# Patient Record
Sex: Male | Born: 1955 | Race: White | Hispanic: No | Marital: Married | Smoking: Never smoker
Health system: Southern US, Community
[De-identification: ages and names within clinical notes are randomized; demographics above are authoritative.]

## PROBLEM LIST (undated history)

## (undated) DIAGNOSIS — E669 Obesity, unspecified: Secondary | ICD-10-CM

## (undated) DIAGNOSIS — E785 Hyperlipidemia, unspecified: Secondary | ICD-10-CM

## (undated) DIAGNOSIS — I25119 Atherosclerotic heart disease of native coronary artery with unspecified angina pectoris: Secondary | ICD-10-CM

## (undated) DIAGNOSIS — N200 Calculus of kidney: Secondary | ICD-10-CM

## (undated) HISTORY — PX: FOOT SURGERY: SHX648

---

## 2006-05-30 HISTORY — PX: CORONARY ARTERY BYPASS GRAFT: SHX141

## 2007-01-25 ENCOUNTER — Ambulatory Visit: Payer: Self-pay | Admitting: Cardiothoracic Surgery

## 2007-03-14 ENCOUNTER — Ambulatory Visit: Payer: Self-pay | Admitting: Thoracic Surgery (Cardiothoracic Vascular Surgery)

## 2015-11-28 HISTORY — PX: PERCUTANEOUS CORONARY ROTOBLATOR INTERVENTION (PCI-R): SHX6015

## 2016-04-04 ENCOUNTER — Inpatient Hospital Stay (HOSPITAL_COMMUNITY)
Admission: EM | Admit: 2016-04-04 | Discharge: 2016-04-06 | DRG: 281 | Disposition: A | Discharge status: Home / Self Care | Payer: BLUE CROSS/BLUE SHIELD | Attending: Cardiovascular Disease | Admitting: Cardiovascular Disease

## 2016-04-04 ENCOUNTER — Encounter (HOSPITAL_COMMUNITY): Payer: Self-pay | Admitting: Physician Assistant

## 2016-04-04 ENCOUNTER — Other Ambulatory Visit: Payer: Self-pay

## 2016-04-04 ENCOUNTER — Emergency Department (HOSPITAL_COMMUNITY): Payer: BLUE CROSS/BLUE SHIELD

## 2016-04-04 DIAGNOSIS — I214 Non-ST elevation (NSTEMI) myocardial infarction: Secondary | ICD-10-CM

## 2016-04-04 DIAGNOSIS — T82855A Stenosis of coronary artery stent, initial encounter: Secondary | ICD-10-CM | POA: Diagnosis present

## 2016-04-04 DIAGNOSIS — Z955 Presence of coronary angioplasty implant and graft: Secondary | ICD-10-CM

## 2016-04-04 DIAGNOSIS — R739 Hyperglycemia, unspecified: Secondary | ICD-10-CM | POA: Diagnosis not present

## 2016-04-04 DIAGNOSIS — I4729 Other ventricular tachycardia: Secondary | ICD-10-CM

## 2016-04-04 DIAGNOSIS — R030 Elevated blood-pressure reading, without diagnosis of hypertension: Secondary | ICD-10-CM

## 2016-04-04 DIAGNOSIS — Z7902 Long term (current) use of antithrombotics/antiplatelets: Secondary | ICD-10-CM

## 2016-04-04 DIAGNOSIS — I472 Ventricular tachycardia: Secondary | ICD-10-CM

## 2016-04-04 DIAGNOSIS — I2510 Atherosclerotic heart disease of native coronary artery without angina pectoris: Secondary | ICD-10-CM

## 2016-04-04 DIAGNOSIS — I119 Hypertensive heart disease without heart failure: Secondary | ICD-10-CM

## 2016-04-04 DIAGNOSIS — M109 Gout, unspecified: Secondary | ICD-10-CM | POA: Diagnosis present

## 2016-04-04 DIAGNOSIS — E782 Mixed hyperlipidemia: Secondary | ICD-10-CM

## 2016-04-04 DIAGNOSIS — T82858A Stenosis of vascular prosthetic devices, implants and grafts, initial encounter: Secondary | ICD-10-CM | POA: Diagnosis present

## 2016-04-04 DIAGNOSIS — Z6830 Body mass index (BMI) 30.0-30.9, adult: Secondary | ICD-10-CM

## 2016-04-04 DIAGNOSIS — E669 Obesity, unspecified: Secondary | ICD-10-CM

## 2016-04-04 DIAGNOSIS — I25119 Atherosclerotic heart disease of native coronary artery with unspecified angina pectoris: Secondary | ICD-10-CM | POA: Diagnosis present

## 2016-04-04 DIAGNOSIS — Z7982 Long term (current) use of aspirin: Secondary | ICD-10-CM

## 2016-04-04 DIAGNOSIS — Z882 Allergy status to sulfonamides status: Secondary | ICD-10-CM

## 2016-04-04 DIAGNOSIS — Z8249 Family history of ischemic heart disease and other diseases of the circulatory system: Secondary | ICD-10-CM

## 2016-04-04 DIAGNOSIS — Z951 Presence of aortocoronary bypass graft: Secondary | ICD-10-CM

## 2016-04-04 DIAGNOSIS — R079 Chest pain, unspecified: Secondary | ICD-10-CM | POA: Diagnosis not present

## 2016-04-04 DIAGNOSIS — I200 Unstable angina: Secondary | ICD-10-CM

## 2016-04-04 DIAGNOSIS — E785 Hyperlipidemia, unspecified: Secondary | ICD-10-CM

## 2016-04-04 HISTORY — DX: Calculus of kidney: N20.0

## 2016-04-04 HISTORY — DX: Obesity, unspecified: E66.9

## 2016-04-04 HISTORY — DX: Hyperlipidemia, unspecified: E78.5

## 2016-04-04 HISTORY — DX: Atherosclerotic heart disease of native coronary artery with unspecified angina pectoris: I25.119

## 2016-04-04 LAB — CBC
HCT: 42.2 % (ref 39.0–52.0)
Hemoglobin: 14 g/dL (ref 13.0–17.0)
MCH: 28.3 pg (ref 26.0–34.0)
MCHC: 33.2 g/dL (ref 30.0–36.0)
MCV: 85.4 fL (ref 78.0–100.0)
PLATELETS: 252 10*3/uL (ref 150–400)
RBC: 4.94 MIL/uL (ref 4.22–5.81)
RDW: 14.2 % (ref 11.5–15.5)
WBC: 8.7 10*3/uL (ref 4.0–10.5)

## 2016-04-04 LAB — COMPREHENSIVE METABOLIC PANEL
ALBUMIN: 4 g/dL (ref 3.5–5.0)
ALT: 25 U/L (ref 17–63)
ANION GAP: 9 (ref 5–15)
AST: 28 U/L (ref 15–41)
Alkaline Phosphatase: 80 U/L (ref 38–126)
BILIRUBIN TOTAL: 2.1 mg/dL — AB (ref 0.3–1.2)
BUN: 11 mg/dL (ref 6–20)
CHLORIDE: 104 mmol/L (ref 101–111)
CO2: 26 mmol/L (ref 22–32)
Calcium: 9.3 mg/dL (ref 8.9–10.3)
Creatinine, Ser: 1.01 mg/dL (ref 0.61–1.24)
GFR calc Af Amer: >60 mL/min (ref >60)
GFR calc non Af Amer: >60 mL/min (ref >60)
GLUCOSE: 122 mg/dL — AB (ref 65–99)
POTASSIUM: 4.1 mmol/L (ref 3.5–5.1)
SODIUM: 139 mmol/L (ref 135–145)
TOTAL PROTEIN: 6.8 g/dL (ref 6.5–8.1)

## 2016-04-04 LAB — URINALYSIS, ROUTINE W REFLEX MICROSCOPIC
Glucose, UA: NEGATIVE mg/dL
Hgb urine dipstick: NEGATIVE
Ketones, ur: NEGATIVE mg/dL
LEUKOCYTES UA: NEGATIVE
NITRITE: NEGATIVE
Protein, ur: NEGATIVE mg/dL
SPECIFIC GRAVITY, URINE: 1.028 (ref 1.005–1.030)
pH: 5.5 (ref 5.0–8.0)

## 2016-04-04 LAB — PROTIME-INR
INR: 1.04
PROTHROMBIN TIME: 13.6 s (ref 11.4–15.2)

## 2016-04-04 LAB — I-STAT TROPONIN, ED: Troponin i, poc: 0 ng/mL (ref 0.00–0.08)

## 2016-04-04 LAB — TSH: TSH: 2.239 u[IU]/mL (ref 0.350–4.500)

## 2016-04-04 LAB — TROPONIN I: Troponin I: 0.61 ng/mL (ref <0.03)

## 2016-04-04 MED ORDER — NITROGLYCERIN IN D5W 200-5 MCG/ML-% IV SOLN
0.0000 ug/min | Freq: Once | INTRAVENOUS | Status: AC
  Administered 2016-04-04: 5 ug/min via INTRAVENOUS
  Filled 2016-04-04: qty 250

## 2016-04-04 MED ORDER — HEPARIN (PORCINE) IN NACL 100-0.45 UNIT/ML-% IJ SOLN
1100.0000 [IU]/h | INTRAMUSCULAR | Status: DC
  Administered 2016-04-04: 1100 [IU]/h via INTRAVENOUS
  Filled 2016-04-04: qty 250

## 2016-04-04 MED ORDER — ASPIRIN 81 MG PO CHEW
81.0000 mg | CHEWABLE_TABLET | ORAL | Status: AC
  Administered 2016-04-05: 81 mg via ORAL
  Filled 2016-04-04: qty 1

## 2016-04-04 MED ORDER — ACETAMINOPHEN 325 MG PO TABS: 650.0000 mg | ORAL_TABLET | ORAL | Status: DC | PRN

## 2016-04-04 MED ORDER — INFLUENZA VAC SPLIT QUAD 0.5 ML IM SUSY: 0.5000 mL | PREFILLED_SYRINGE | INTRAMUSCULAR | Status: DC | PRN

## 2016-04-04 MED ORDER — ASPIRIN EC 81 MG PO TBEC
81.0000 mg | DELAYED_RELEASE_TABLET | Freq: Every day | ORAL | Status: DC
  Administered 2016-04-06: 81 mg via ORAL
  Filled 2016-04-04: qty 1

## 2016-04-04 MED ORDER — ISOSORBIDE MONONITRATE ER 30 MG PO TB24
30.0000 mg | ORAL_TABLET | Freq: Every day | ORAL | Status: DC
  Administered 2016-04-05 – 2016-04-06 (×2): 30 mg via ORAL
  Filled 2016-04-04 (×2): qty 1

## 2016-04-04 MED ORDER — HEPARIN BOLUS VIA INFUSION
4000.0000 [IU] | Freq: Once | INTRAVENOUS | Status: AC
  Administered 2016-04-04: 4000 [IU] via INTRAVENOUS
  Filled 2016-04-04: qty 4000

## 2016-04-04 MED ORDER — NITROGLYCERIN 0.4 MG SL SUBL
SUBLINGUAL_TABLET | SUBLINGUAL | Status: AC
  Administered 2016-04-04: 22:00:00
  Administered 2016-04-04: 0.4 mg
  Filled 2016-04-04: qty 1

## 2016-04-04 MED ORDER — HEPARIN SODIUM (PORCINE) 5000 UNIT/ML IJ SOLN: 5000.0000 [IU] | Freq: Three times a day (TID) | INTRAMUSCULAR | Status: DC

## 2016-04-04 MED ORDER — SERTRALINE HCL 50 MG PO TABS
50.0000 mg | ORAL_TABLET | Freq: Every day | ORAL | Status: DC
  Administered 2016-04-05 – 2016-04-06 (×2): 50 mg via ORAL
  Filled 2016-04-04 (×2): qty 1

## 2016-04-04 MED ORDER — CLOPIDOGREL BISULFATE 75 MG PO TABS: 75.0000 mg | ORAL_TABLET | Freq: Every day | ORAL | Status: DC

## 2016-04-04 MED ORDER — NITROGLYCERIN 0.4 MG SL SUBL
0.4000 mg | SUBLINGUAL_TABLET | SUBLINGUAL | Status: AC | PRN
  Administered 2016-04-04 (×3): 0.4 mg via SUBLINGUAL

## 2016-04-04 MED ORDER — MORPHINE SULFATE (PF) 4 MG/ML IV SOLN
4.0000 mg | Freq: Once | INTRAVENOUS | Status: AC
  Administered 2016-04-04: 4 mg via INTRAVENOUS
  Filled 2016-04-04: qty 1

## 2016-04-04 MED ORDER — ONDANSETRON HCL 4 MG/2ML IJ SOLN: 4.0000 mg | Freq: Four times a day (QID) | INTRAMUSCULAR | Status: DC | PRN

## 2016-04-04 MED ORDER — NITROGLYCERIN 0.4 MG SL SUBL
SUBLINGUAL_TABLET | SUBLINGUAL | Status: AC
  Administered 2016-04-04: 0.4 mg via SUBLINGUAL
  Filled 2016-04-04: qty 1

## 2016-04-04 MED ORDER — ROSUVASTATIN CALCIUM 20 MG PO TABS
40.0000 mg | ORAL_TABLET | Freq: Every day | ORAL | Status: DC
  Administered 2016-04-05 – 2016-04-06 (×2): 40 mg via ORAL
  Filled 2016-04-04 (×2): qty 2

## 2016-04-04 NOTE — ED Notes (Signed)
Reports received from [place] Far[place]abe, [place]. Patient complains of no pain at this time.

## 2016-04-04 NOTE — Progress Notes (Addendum)
Have ordered for pharmacy to help with med rec as patient unsure of doses. I wrote order for nurse to page me when this is complete, or if after hours, to page the on-call.  Also, Risks and benefits of cardiac catheterization have been discussed with the patient.  These include bleeding, infection, kidney damage, stroke, heart attack, death.  The patient understands these risks and is willing to proceed. He also understands we were informed cath board is approaching full tomorrow and there is a chance procedure may be delayed. He was asked to make his nursing team aware of any recurrent chest pain.  Dayna Dunn PA-C

## 2016-04-04 NOTE — ED Notes (Signed)
Attempted to call report.  RN unavailable at this time,.

## 2016-04-04 NOTE — Progress Notes (Signed)
CRITICAL VALUE ALERT  Critical value received:  Troponin 0.61  Date of notification:  04/04/2016  Time of notification:  07:44 pm  Critical value read back: yes  Nurse who received alert: Jinny Sandershohina Rickeya Manus RN  MD notified (1st page):  Nahser, MD  Time of first page:  07:49 pm  MD notified (2nd page): Virgina OrganQureshi, MD  Time of second page: 8:04pm  Responding MD:  Virgina OrganQureshi, MD  Time MD responded:  08:49pm  New order of heparin gtt -- awaiting pharmacy approval. Will continue to monitor closely.

## 2016-04-04 NOTE — ED Notes (Signed)
PA Annabelle Harmanana with cardiology requested nitro stopped due to patient has been chest pain free.

## 2016-04-04 NOTE — ED Provider Notes (Signed)
Complains of anterior chest pain rating to left arm onset 10:30 AM today while "working on the floor" and is constant, he's been treated with 7 sublingual nitroglycerin without relief. He does report some pain relief after treatment with intravenous morphine. Other associated symptoms include nausea and sweatiness. Nausea and sweatiness have resolved.   Doug SouSam Burdett Pinzon, MD 04/04/16 1719

## 2016-04-04 NOTE — ED Provider Notes (Signed)
[facility]-EMER[facility]ENCY DEPT Provider Note   CSN: [id number] Arrival date & time: 04/04/16  1129     History   Chief Complaint Chief Complaint  Patient presents with  . Chest Pain    HPI Johnny RossettiCharles F Delaine is a 60 y.o. male who presents with an acute onset of chest pain at 10AM this morning. PMH significant for CAD s/p CABG and stenting (last [facility] in July [facility]), gout, hyperlipidemia. [facility] - last seen one week ago and was told to follow up in a year. 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 The pain is substernal, severe, constant, and radiates to the L arm. He reports associated diaphoresis, nausea and SOB when the pain was severe which has resolved since being in the ED. He took three nitro without relief and therefore called EMS. They gave him an additional nitro and 324mg  ASA. Pain is currently a 2-3 but is increasing in severity again. Denies fever, chills, syncope, palpitations, leg swelling, current SOB, cough, abdominal pain, vomiting. He has been compliant with Plavix. He has had a total of 7 Nitro today between home, EMS, and ED doses.   HPI  No past medical history on file.  There are no active problems to display for this patient.   No past surgical history on file.     Home Medications    Prior to Admission medications   Medication Sig Start Date End Date Taking? Authorizing Provider  aspirin EC 81 MG tablet Take 81 mg by mouth daily.   Yes Historical Provider, MD  clopidogrel (PLAVIX) 75 MG tablet Take 75 mg by mouth daily.   Yes Historical Provider, MD  nitroGLYCERIN (NITROSTAT) 0.4 MG SL tablet Place 0.4 mg under the tongue every 5 (five) minutes as needed for chest pain.   Yes Historical Provider, MD  PRESCRIPTION MEDICATION Take 1 tablet by mouth daily. Metoprolol   Yes Historical Provider, MD  PRESCRIPTION MEDICATION Take 1 tablet by mouth daily. Cholesterol   Yes Historical  Provider, MD  PRESCRIPTION MEDICATION Take 1 tablet by mouth daily. Isosorbide   Yes Historical Provider, MD  PRESCRIPTION MEDICATION Take 0.5 tablets by mouth daily. Sertraline   Yes Historical Provider, MD    Family History No family history on file.  Social History Social History  Substance Use Topics  . Smoking status: Not on file  . Smokeless tobacco: Not on file  . Alcohol use Not on file     Allergies   Sulfa antibiotics   Review of Systems Review of Systems  Constitutional: Positive for diaphoresis. Negative for chills and fever.  Respiratory: Positive for shortness of breath. Negative for cough.   Cardiovascular: Positive for chest pain. Negative for palpitations and leg swelling.  Gastrointestinal: Positive for diarrhea and nausea. Negative for abdominal pain and vomiting.     Physical Exam Updated Vital Signs BP (!) 165/107 (BP Location: Right Arm)   Pulse 71   Temp 98 F (36.7 C) (Oral)   Resp 18   Ht 5\' 7"  (1.702 m)   Wt 88.5 kg   SpO2 98%   BMI 30.54 kg/m   Physical Exam  Constitutional: He is oriented to person, place, and time. He appears well-developed and well-nourished. No distress.  HENT:  Head: Normocephalic and atraumatic.  Eyes: Conjunctivae are normal. Pupils are equal, round, and reactive to light. Right eye exhibits no discharge. Left eye exhibits no discharge.  No scleral icterus.  Neck: Normal range of motion. Neck supple.  Cardiovascular: Normal rate and regular rhythm.  Exam reveals no gallop and no friction rub.   No murmur heard. Pulmonary/Chest: Effort normal and breath sounds normal. No respiratory distress. He has no wheezes. He has no rales. He exhibits no tenderness.  Abdominal: Soft. Bowel sounds are normal. He exhibits no distension and no mass. There is no tenderness. There is no rebound and no guarding. No hernia.  Musculoskeletal: He exhibits no edema.  No leg swelling  Neurological: He is alert and oriented to person,  place, and time.  Skin: Skin is warm and dry.  Psychiatric: He has a normal mood and affect.  Nursing note and vitals reviewed.    ED Treatments / Results  Labs (all labs ordered are listed, but only abnormal results are displayed) Labs Reviewed  COMPREHENSIVE METABOLIC PANEL - Abnormal; Notable for the following:       Result Value   Glucose, Bld 122 (*)    Total Bilirubin 2.1 (*)    All other components within normal limits  URINALYSIS, ROUTINE W REFLEX MICROSCOPIC (NOT AT [facility]) - Abnormal; Notable for the following:    Color, Urine AMBER (*)    APPearance CLOUDY (*)    Bilirubin Urine SMALL (*)    All other components within normal limits  CBC  I-STAT TROPOININ, ED    EKG  EKG Interpretation  Date/Time:  Monday April 04 2016 11:41:45 EST Ventricular Rate:  70 PR Interval:    QRS Duration: 85 QT Interval:  396 QTC Calculation: 428 R Axis:   8 Text Interpretation:  Sinus rhythm Prolonged PR interval Abnormal R-wave progression, early transition Nonspecific T abnrm, anterolateral leads No old tracing to compare Confirmed by Ethelda ChickJACUBOWITZ  MD, SAM [phone]) on 04/04/2016 12:26:33 PM       Radiology Dg Chest Port 1 View  Result Date: 04/04/2016 CLINICAL DATA:  Chest pain EXAM: PORTABLE CHEST 1 VIEW COMPARISON:  None. FINDINGS: Cardiomediastinal silhouette is unremarkable. Status post CABG. No infiltrate or pleural effusion. No pulmonary edema. IMPRESSION: No active disease. Electronically Signed   By: Natasha MeadLiviu  Pop M.D.   On: 04/04/2016 14:31    Procedures Procedures (including critical care time)  CRITICAL CARE Performed by: Bethel BornKelly Marie Anayely Constantine   Total critical care time: 30 minutes  Critical care time was exclusive of separately billable procedures and treating other patients.  Critical care was necessary to treat or prevent imminent or life-threatening deterioration.  Critical care was time spent personally by me on the following activities: development of  treatment plan with patient and/or surrogate as well as nursing, discussions with consultants, evaluation of patient's response to treatment, examination of patient, obtaining history from patient or surrogate, ordering and performing treatments and interventions, ordering and review of laboratory studies, ordering and review of radiographic studies, pulse oximetry and re-evaluation of patient's condition.   Medications Ordered in ED Medications  nitroGLYCERIN (NITROSTAT) SL tablet 0.4 mg (0.4 mg Sublingual Given 04/04/16 1246)  morphine 4 MG/ML injection 4 mg (4 mg Intravenous Given 04/04/16 1252)  nitroGLYCERIN 50 mg in dextrose 5 % 250 mL (0.2 mg/mL) infusion (0 mcg/min Intravenous Stopped 04/04/16 1613)     Initial Impression / Assessment and Plan / ED Course  I have reviewed the triage vital signs and the nursing notes.  Pertinent labs & imaging results that were available during my care of the patient were reviewed by me and considered in my medical decision making (see  chart for details).  Clinical Course    60 year old male presents with unstable angina. Patient is afebrile, not tachycardic or tachypneic, and not hypoxic. He is hypertensive which is improved after nitro drip started. Chest pain also improved after nitro and morphine. Labs are unremarkable. Troponin is 0. UA clean. CXR is negative. Shared visit with Dr. Ethelda Chick. Will consult Cardiology due to patient's concerning history and recent stenting.   Cardiology will admit for cath in the morning. Appreciate assistance.   Final Clinical Impressions(s) / ED Diagnoses   Final diagnoses:  None    New Prescriptions New Prescriptions   No medications on file     Bethel Born, PA-C 04/04/16 1708    Doug Sou, MD 04/04/16 1737

## 2016-04-04 NOTE — Progress Notes (Signed)
ANTICOAGULATION CONSULT NOTE - Initial Consult  Pharmacy Consult for heparin Indication: chest pain/ACS  Allergies  Allergen Reactions  . Sulfa Antibiotics Rash    Patient Measurements: Height: 5\' 7"  (170.2 cm) Weight: 192 lb 6.4 oz (87.3 kg) IBW/kg (Calculated) : 66.1 Heparin Dosing Weight: 84 KG  Vital Signs: Temp: 97.5 F (36.4 C) (11/06 2012) Temp Source: Oral (11/06 2012) BP: 134/81 (11/06 2012) Pulse Rate: 60 (11/06 2012)  Labs:  Recent Labs  04/04/16 1216 04/04/16 1848  HGB 14.0  --   HCT 42.2  --   PLT 252  --   LABPROT  --  13.6  INR  --  1.04  CREATININE 1.01  --   TROPONINI  --  0.61*    Estimated Creatinine Clearance: 82.1 mL/min (by C-G formula based on SCr of 1.01 mg/dL).   Medical History: Past Medical History:  Diagnosis Date  . CAD (coronary artery disease)    a. remote PCI. b. CABGx4 at [facility]. c. [facility].  . Hyperlipidemia   . Kidney stones   . Obesity    Medications:  Prescriptions Prior to Admission  Medication Sig Dispense Refill Last Dose  . aspirin EC 81 MG tablet Take 81 mg by mouth daily.   04/04/2016 at Unknown time  . clopidogrel (PLAVIX) 75 MG tablet Take 75 mg by mouth daily.   04/04/2016 at Unknown time  . ibuprofen (ADVIL,MOTRIN) 200 MG tablet Take 400 mg by mouth every 6 (six) hours as needed for headache (pain).   week ago  . isosorbide mononitrate (IMDUR) 30 MG 24 hr tablet Take 30 mg by mouth daily.   04/04/2016 at Unknown time  . metoprolol succinate (TOPROL-XL) 100 MG 24 hr tablet Take 50 mg by mouth daily. Take with or immediately following a meal.   04/04/2016 at 700  . nitroGLYCERIN (NITROSTAT) 0.4 MG SL tablet Place 0.4 mg under the tongue every 5 (five) minutes as needed for chest pain.   04/04/2016 at 1030  . rosuvastatin (CRESTOR) 40 MG tablet Take 40 mg by mouth at bedtime.    04/03/2016 at Unknown time  . sertraline (ZOLOFT) 100 MG tablet Take 50 mg by mouth daily.   04/04/2016 at Unknown  time    Assessment: Johnny Pearson is a 60 y.o. male with history of CAD s/p CABG/PCI, HLD, obesity, and kidney stones who [facility].  Troponin 0.61 and pharmacy has been asked to start heparin. INR 1.04, CBC WNL  Goal of Therapy:  Heparin level 0.3-0.7 units/ml Monitor platelets by anticoagulation protocol: Yes   Plan:  Give 4000 units bolus x 1 Start heparin infusion at 1100 units/hr Check anti-Xa level in 6 hours and daily while on heparin Continue to monitor H&H and platelets  Sou Nohr L Juanya Villavicencio 04/04/2016,9:33 PM

## 2016-04-04 NOTE — ED Notes (Signed)
MD in the room.

## 2016-04-04 NOTE — H&P (Signed)
Cardiology History & Physical    Patient ID: Johnny RossettiCharles F Pearson MRN: [id number], DOB: 03/06/1956 Date of Encounter: 04/04/2016, 2:59 PM Primary Physician: No primary care provider on file. Primary Cardiologist[facility]  Chief Complaint: chest pain Reason for Admission: chest pain Requesting MD: Dr. Ethelda ChickJacubowitz  HPI: Johnny RossettiCharles F Engelmann is a 60 y.o. male with history of CAD s/p CABG/PCI, HLD, obesity, and kidney stones who [facility]. He reports prior h/o remote PCI, then [facility] around [facility] (grafts unknown). In July 2017 [facility] by Dr. Wanda PlumpHumphries for chest pain and subsequently underwent PCI by [facility] Marn[facility]Ghandi [facility] with a 3.25x528mm Xience stent to the LMCA per stent card. This information is not pulling up by CareEverywhere. He saw his cardiologist a few weeks ago and was doing great; the plan was for follow-up in 1 year. He does not typically get chest discomfort unless he does extremely strenuous activity. 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 Somewhat sharp in quality, unlike any pain he'd ever had before. He stopped and took 3 SL NTG over the course of several minutes without relief so called EMS. He was given several additional NTG with only minimal relief, as well as 324mg  ASA. He was given 4mg  IV morphine in the ED which finally helped relieve his pain. He currently denies residual pain, just feels funny. Pain is not worse with inspiration. Total duration of pain 1.5-2 hours. He reports med compliance with ASA/Plavix. No SOB, palpitations, syncope, LEE. Trop neg, glucose 122, Cr 1.01. Pulse ox 94-99%. Currently on a nitro drip.   He does not know the doses of his home medications - so far the list includes ASA, Plavix, metoprolol, some sort of cholesterol med,  isosorbide, sertraline and SL NTG PRN. Formal med rec clarification pending by pharmacy.  Past Medical History:  Diagnosis Date  . CAD (coronary artery disease)    a. remote PCI. b. CABGx4 at [facility]. c. [facility].  . Hyperlipidemia   . Kidney stones   . Obesity      Surgical History:  Past Surgical History:  Procedure Laterality Date  . CORONARY ARTERY BYPASS GRAFT    . FOOT SURGERY       Home Meds: Prior to Admission medications   Medication Sig Start Date End Date Taking? Authorizing Provider  aspirin EC 81 MG tablet Take 81 mg by mouth daily.   Yes Historical Provider, MD  clopidogrel (PLAVIX) 75 MG tablet Take 75 mg by mouth daily.   Yes Historical Provider, MD  nitroGLYCERIN (NITROSTAT) 0.4 MG SL tablet Place 0.4 mg under the tongue every 5 (five) minutes as needed for chest pain.   Yes Historical Provider, MD  PRESCRIPTION MEDICATION Take 1 tablet by mouth daily. Metoprolol   Yes Historical Provider, MD  PRESCRIPTION MEDICATION Take 1 tablet by mouth daily. Cholesterol   Yes Historical Provider, MD  PRESCRIPTION MEDICATION Take 1 tablet by mouth daily. Isosorbide   Yes Historical Provider, MD  PRESCRIPTION MEDICATION Take 0.5 tablets by mouth daily. Sertraline   Yes Historical Provider, MD    Allergies:  Allergies  Allergen Reactions  . Sulfa Antibiotics Rash    Social History   Social History  . Marital status:  Married    Spouse name: N/A  . Number of children: N/A  . Years of education: N/A   Occupational History  . Not on file.   Social History Main Topics  . Smoking status: Never Smoker  . Smokeless tobacco: Not on file  . Alcohol use No  . Drug use: No  . Sexual activity: Not on file   Other Topics Concern  . Not on file   Social History Narrative  . No narrative on file     Family History  Problem Relation Age of Onset  . Heart disease Father     Heart disease on dad's side  . Heart disease Maternal Uncle      Review of Systems: no fevers, chills. All other systems reviewed and are otherwise negative except as noted above.  Labs:   Lab Results  Component Value Date   WBC 8.7 04/04/2016   HGB 14.0 04/04/2016   HCT 42.2 04/04/2016   MCV 85.4 04/04/2016   PLT 252 04/04/2016    Recent Labs Lab 04/04/16 1216  NA 139  K 4.1  CL 104  CO2 26  BUN 11  CREATININE 1.01  CALCIUM 9.3  PROT 6.8  BILITOT 2.1*  ALKPHOS 80  ALT 25  AST 28  GLUCOSE 122*   Radiology/Studies:  Dg Chest Port 1 View  Result Date: 04/04/2016 CLINICAL DATA:  Chest pain EXAM: PORTABLE CHEST 1 VIEW COMPARISON:  None. FINDINGS: Cardiomediastinal silhouette is unremarkable. Status post CABG. No infiltrate or pleural effusion. No pulmonary edema. IMPRESSION: No active disease. Electronically Signed   By: Natasha Mead M.D.   On: 04/04/2016 14:31   Wt Readings from Last 3 Encounters:  04/04/16 195 lb (88.5 kg)    EKG: NSR 70bpm, nonspecific ST-T changes with TW flattening in I and TWI avL & V2, No prior to compare to.  Physical Exam: Blood pressure 133/86, pulse 71, temperature 97.8 F (36.6 C), resp. rate 18, height 5\' 7"  (1.702 m), weight 195 lb (88.5 kg), SpO2 94 %. Body mass index is 30.54 kg/m. General: Well developed, well nourished WM in no acute distress. Head: Normocephalic, atraumatic, sclera non-icteric, no xanthomas, nares are without discharge.  Neck: Negative for carotid bruits. JVD not elevated. Lungs: Clear bilaterally to auscultation without wheezes, rales, or rhonchi. Breathing is unlabored. Heart: RRR with S1 S2. No murmurs, rubs, or gallops appreciated. Abdomen: Soft, non-tender, non-distended with normoactive bowel sounds. No hepatomegaly. No rebound/guarding. No obvious abdominal masses. Msk:  Strength and tone appear normal for age. Extremities: No clubbing or cyanosis. No edema.  Distal pedal pulses are 2+ and equal bilaterally. Neuro: Alert and oriented X 3. No focal deficit. No facial  asymmetry. Moves all extremities spontaneously. Psych:  Responds to questions appropriately with a normal affect.    Assessment and Plan  75M with CAD (remote PCI then [facility] in [facility] at [facility], DES to LMCA in 11/2015 at [facility]), HLD, obesity, and kidney stones who presented to [facility] with chest pain.   1. Chest pain - somewhat concerning for angina. EKG nonspecific changes with no prior to compare to, initial troponin negative. Will review plan for workup with MD.  2. Hyperlipidemia - need to clarify home meds. Check lipids in AM.  3. Obesity - Body mass index is 30.54 kg/m. Once coronary status is clarified, would encourage regular physical activity.  4. Elevated BP - initial BP 160s/100s, patient denies h/o HTN. Improved after many SL NTG and NTG gtt. Follow back on home  regimen. NTG gtt stopped due to decreasing BP and CP free in ED.  Signed, Laurann Montanaayna N Dunn PA-C 04/04/2016, 2:59 PM Pager: [phone]   Attending Note:   The patient was seen and examined.  Agree with assessment and plan as noted above.  Changes made to the above note as needed.  Patient seen and independently examined with Ronie Spiesayna Dunn, PA .   We discussed all aspects of the encounter. I agree with the assessment and plan as stated above.  Pt with long hx of cad. Now presents with a similar cp - much worse than any of his previous chest pain troponiin x 1 is negative. Will anticipate cath tomorrow   Discussed risks, benefits, options.   He understands and agrees to proceed.    I have spent a total of 40 minutes with patient reviewing hospital  notes , telemetry, EKGs, labs and examining patient as well as establishing an assessment and plan that was discussed with the patient. > 50% of time was spent in direct patient care.    Vesta MixerPhilip J. Nahser, Montez HagemanJr., MD, [facility] 04/04/2016, 4:18 PM [address],  [address] Office [phone] Pager [phone]- [phone]

## 2016-04-04 NOTE — ED Notes (Signed)
Paged Johnny Pearson with cardiology because patient is chest pain free and SBP is 109.  Wants BPs trended and nitro continue until she sees the patient.

## 2016-04-04 NOTE — ED Triage Notes (Signed)
Patient states that chest pain began at 1000 this AM.  States that the pain was sharp and non radiating and was associated with nausea and diaphoresis.  Patient took 3 ntg and called EMS.  324 ASA and 1 additional NTG per EMS.

## 2016-04-04 NOTE — ED Notes (Signed)
Patient asked for urine sample. States that he is unable to give sample at this time. Patient given urinal.

## 2016-04-05 ENCOUNTER — Encounter (HOSPITAL_COMMUNITY)
Admission: EM | Disposition: A | Discharge status: Home / Self Care | Payer: Self-pay | Source: Home / Self Care | Attending: Cardiovascular Disease

## 2016-04-05 ENCOUNTER — Encounter (HOSPITAL_COMMUNITY): Payer: Self-pay

## 2016-04-05 DIAGNOSIS — I119 Hypertensive heart disease without heart failure: Secondary | ICD-10-CM | POA: Diagnosis present

## 2016-04-05 DIAGNOSIS — T82858A Stenosis of vascular prosthetic devices, implants and grafts, initial encounter: Secondary | ICD-10-CM | POA: Diagnosis present

## 2016-04-05 DIAGNOSIS — I214 Non-ST elevation (NSTEMI) myocardial infarction: Secondary | ICD-10-CM

## 2016-04-05 DIAGNOSIS — I2510 Atherosclerotic heart disease of native coronary artery without angina pectoris: Secondary | ICD-10-CM

## 2016-04-05 DIAGNOSIS — R079 Chest pain, unspecified: Secondary | ICD-10-CM | POA: Diagnosis present

## 2016-04-05 DIAGNOSIS — Z882 Allergy status to sulfonamides status: Secondary | ICD-10-CM | POA: Diagnosis not present

## 2016-04-05 DIAGNOSIS — M109 Gout, unspecified: Secondary | ICD-10-CM | POA: Diagnosis present

## 2016-04-05 DIAGNOSIS — Z951 Presence of aortocoronary bypass graft: Secondary | ICD-10-CM | POA: Diagnosis not present

## 2016-04-05 DIAGNOSIS — Z7902 Long term (current) use of antithrombotics/antiplatelets: Secondary | ICD-10-CM | POA: Diagnosis not present

## 2016-04-05 DIAGNOSIS — E785 Hyperlipidemia, unspecified: Secondary | ICD-10-CM | POA: Diagnosis present

## 2016-04-05 DIAGNOSIS — Z6830 Body mass index (BMI) 30.0-30.9, adult: Secondary | ICD-10-CM | POA: Diagnosis not present

## 2016-04-05 DIAGNOSIS — E782 Mixed hyperlipidemia: Secondary | ICD-10-CM | POA: Diagnosis not present

## 2016-04-05 DIAGNOSIS — Z7982 Long term (current) use of aspirin: Secondary | ICD-10-CM | POA: Diagnosis not present

## 2016-04-05 DIAGNOSIS — Z8249 Family history of ischemic heart disease and other diseases of the circulatory system: Secondary | ICD-10-CM | POA: Diagnosis not present

## 2016-04-05 DIAGNOSIS — Z955 Presence of coronary angioplasty implant and graft: Secondary | ICD-10-CM | POA: Diagnosis not present

## 2016-04-05 DIAGNOSIS — T82855A Stenosis of coronary artery stent, initial encounter: Secondary | ICD-10-CM | POA: Diagnosis present

## 2016-04-05 HISTORY — PX: CARDIAC CATHETERIZATION: SHX172

## 2016-04-05 LAB — BASIC METABOLIC PANEL
Anion gap: 12 (ref 5–15)
BUN: 14 mg/dL (ref 6–20)
CHLORIDE: 105 mmol/L (ref 101–111)
CO2: 21 mmol/L — AB (ref 22–32)
Calcium: 8.9 mg/dL (ref 8.9–10.3)
Creatinine, Ser: 0.87 mg/dL (ref 0.61–1.24)
GFR calc non Af Amer: >60 mL/min (ref >60)
Glucose, Bld: 110 mg/dL — ABNORMAL HIGH (ref 65–99)
POTASSIUM: 5.4 mmol/L — AB (ref 3.5–5.1)
SODIUM: 138 mmol/L (ref 135–145)

## 2016-04-05 LAB — CBC
HCT: 42.8 % (ref 39.0–52.0)
Hemoglobin: 14 g/dL (ref 13.0–17.0)
MCH: 28.5 pg (ref 26.0–34.0)
MCHC: 32.7 g/dL (ref 30.0–36.0)
MCV: 87 fL (ref 78.0–100.0)
PLATELETS: 239 10*3/uL (ref 150–400)
RBC: 4.92 MIL/uL (ref 4.22–5.81)
RDW: 14.6 % (ref 11.5–15.5)
WBC: 9.9 10*3/uL (ref 4.0–10.5)

## 2016-04-05 LAB — HEMOGLOBIN A1C
Hgb A1c MFr Bld: 5.9 % — ABNORMAL HIGH (ref 4.8–5.6)
MEAN PLASMA GLUCOSE: 123 mg/dL

## 2016-04-05 LAB — LIPID PANEL
Cholesterol: 187 mg/dL (ref 0–200)
HDL: 45 mg/dL (ref >40)
LDL Cholesterol: 121 mg/dL — ABNORMAL HIGH (ref 0–99)
TRIGLYCERIDES: 106 mg/dL (ref <150)
Total CHOL/HDL Ratio: 4.2 RATIO
VLDL: 21 mg/dL (ref 0–40)

## 2016-04-05 LAB — TROPONIN I
TROPONIN I: 1.98 ng/mL — AB (ref <0.03)
TROPONIN I: 2.11 ng/mL — AB (ref <0.03)

## 2016-04-05 LAB — POCT ACTIVATED CLOTTING TIME: Activated Clotting Time: 494 seconds

## 2016-04-05 LAB — HEPARIN LEVEL (UNFRACTIONATED): Heparin Unfractionated: 0.42 IU/mL (ref 0.30–0.70)

## 2016-04-05 LAB — MAGNESIUM: Magnesium: 2.3 mg/dL (ref 1.7–2.4)

## 2016-04-05 SURGERY — LEFT HEART CATH AND CORS/GRAFTS ANGIOGRAPHY

## 2016-04-05 MED ORDER — TICAGRELOR 90 MG PO TABS
ORAL_TABLET | ORAL | Status: AC
  Filled 2016-04-05: qty 1

## 2016-04-05 MED ORDER — FENTANYL CITRATE (PF) 100 MCG/2ML IJ SOLN
INTRAMUSCULAR | Status: AC
  Filled 2016-04-05: qty 2

## 2016-04-05 MED ORDER — SODIUM CHLORIDE 0.9% FLUSH: 3.0000 mL | INTRAVENOUS | Status: DC | PRN

## 2016-04-05 MED ORDER — RANOLAZINE ER 500 MG PO TB12
500.0000 mg | ORAL_TABLET | Freq: Two times a day (BID) | ORAL | Status: DC
  Administered 2016-04-05 – 2016-04-06 (×3): 500 mg via ORAL
  Filled 2016-04-05 (×3): qty 1

## 2016-04-05 MED ORDER — TICAGRELOR 90 MG PO TABS
ORAL_TABLET | ORAL | Status: AC
Start: 2016-04-05 — End: 2016-04-05
  Filled 2016-04-05: qty 1

## 2016-04-05 MED ORDER — NITROGLYCERIN 0.4 MG SL SUBL
SUBLINGUAL_TABLET | SUBLINGUAL | Status: AC
  Administered 2016-04-05: 0.4 mg
  Administered 2016-04-05: 05:00:00
  Filled 2016-04-05: qty 1

## 2016-04-05 MED ORDER — SODIUM CHLORIDE 0.9 % WEIGHT BASED INFUSION
3.0000 mL/kg/h | INTRAVENOUS | Status: DC
  Administered 2016-04-05: 3 mL/kg/h via INTRAVENOUS

## 2016-04-05 MED ORDER — SODIUM CHLORIDE 0.9 % IV SOLN: 250.0000 mL | INTRAVENOUS | Status: DC | PRN

## 2016-04-05 MED ORDER — VERAPAMIL HCL 2.5 MG/ML IV SOLN
INTRAVENOUS | Status: DC | PRN
  Administered 2016-04-05: 10 mL via INTRA_ARTERIAL

## 2016-04-05 MED ORDER — METOPROLOL SUCCINATE ER 50 MG PO TB24
50.0000 mg | ORAL_TABLET | Freq: Every day | ORAL | Status: DC
  Administered 2016-04-05 – 2016-04-06 (×2): 50 mg via ORAL
  Filled 2016-04-05 (×2): qty 1

## 2016-04-05 MED ORDER — HEPARIN SODIUM (PORCINE) 1000 UNIT/ML IJ SOLN
INTRAMUSCULAR | Status: AC
Start: 2016-04-05 — End: 2016-04-05
  Filled 2016-04-05: qty 1

## 2016-04-05 MED ORDER — SODIUM CHLORIDE 0.9% FLUSH
3.0000 mL | Freq: Two times a day (BID) | INTRAVENOUS | Status: DC
  Administered 2016-04-05: 3 mL via INTRAVENOUS

## 2016-04-05 MED ORDER — MORPHINE SULFATE (PF) 10 MG/ML IV SOLN: 2.0000 mg | INTRAVENOUS | Status: DC | PRN

## 2016-04-05 MED ORDER — MORPHINE SULFATE (PF) 4 MG/ML IV SOLN
INTRAVENOUS | Status: AC
  Filled 2016-04-05: qty 1

## 2016-04-05 MED ORDER — HEPARIN (PORCINE) IN NACL 2-0.9 UNIT/ML-% IJ SOLN
INTRAMUSCULAR | Status: DC | PRN
  Administered 2016-04-05: 1500 mL

## 2016-04-05 MED ORDER — TICAGRELOR 90 MG PO TABS
ORAL_TABLET | ORAL | Status: DC | PRN
  Administered 2016-04-05: 180 mg via ORAL

## 2016-04-05 MED ORDER — SODIUM CHLORIDE 0.9 % WEIGHT BASED INFUSION: 1.0000 mL/kg/h | INTRAVENOUS | Status: DC

## 2016-04-05 MED ORDER — FENTANYL CITRATE (PF) 100 MCG/2ML IJ SOLN
INTRAMUSCULAR | Status: DC | PRN
  Administered 2016-04-05: 50 ug via INTRAVENOUS

## 2016-04-05 MED ORDER — MIDAZOLAM HCL 2 MG/2ML IJ SOLN
INTRAMUSCULAR | Status: DC | PRN
  Administered 2016-04-05: 2 mg via INTRAVENOUS

## 2016-04-05 MED ORDER — VERAPAMIL HCL 2.5 MG/ML IV SOLN
INTRAVENOUS | Status: AC
  Filled 2016-04-05: qty 2

## 2016-04-05 MED ORDER — BIVALIRUDIN 250 MG IV SOLR
INTRAVENOUS | Status: AC
  Filled 2016-04-05: qty 250

## 2016-04-05 MED ORDER — NITROGLYCERIN IN D5W 200-5 MCG/ML-% IV SOLN: 10.0000 ug/min | INTRAVENOUS | Status: DC

## 2016-04-05 MED ORDER — IOPAMIDOL (ISOVUE-370) INJECTION 76%
INTRAVENOUS | Status: AC
  Filled 2016-04-05: qty 125

## 2016-04-05 MED ORDER — BIVALIRUDIN BOLUS VIA INFUSION - CUPID
INTRAVENOUS | Status: DC | PRN
  Administered 2016-04-05: 65.475 mg via INTRAVENOUS

## 2016-04-05 MED ORDER — TICAGRELOR 60 MG PO TABS
60.0000 mg | ORAL_TABLET | Freq: Two times a day (BID) | ORAL | Status: DC
  Administered 2016-04-06: 60 mg via ORAL
  Filled 2016-04-05: qty 1

## 2016-04-05 MED ORDER — SODIUM CHLORIDE 0.9 % IV SOLN
INTRAVENOUS | Status: DC | PRN
  Administered 2016-04-05: 1.75 mg/kg/h via INTRAVENOUS

## 2016-04-05 MED ORDER — LIDOCAINE HCL (PF) 1 % IJ SOLN
INTRAMUSCULAR | Status: AC
  Filled 2016-04-05: qty 30

## 2016-04-05 MED ORDER — IOPAMIDOL (ISOVUE-370) INJECTION 76%
INTRAVENOUS | Status: DC | PRN
  Administered 2016-04-05: 175 mL via INTRA_ARTERIAL

## 2016-04-05 MED ORDER — HEPARIN (PORCINE) IN NACL 2-0.9 UNIT/ML-% IJ SOLN
INTRAMUSCULAR | Status: AC
  Filled 2016-04-05: qty 1000

## 2016-04-05 MED ORDER — HEPARIN SODIUM (PORCINE) 1000 UNIT/ML IJ SOLN
INTRAMUSCULAR | Status: DC | PRN
  Administered 2016-04-05: 5000 [IU] via INTRAVENOUS

## 2016-04-05 MED ORDER — MORPHINE SULFATE (PF) 2 MG/ML IV SOLN: 2.0000 mg | INTRAVENOUS | Status: DC | PRN

## 2016-04-05 MED ORDER — MORPHINE SULFATE (PF) 2 MG/ML IV SOLN
2.0000 mg | INTRAVENOUS | Status: DC | PRN
  Administered 2016-04-05: 2 mg via INTRAVENOUS

## 2016-04-05 MED ORDER — CLOPIDOGREL BISULFATE 75 MG PO TABS: 75.0000 mg | ORAL_TABLET | Freq: Every day | ORAL | Status: DC

## 2016-04-05 MED ORDER — MORPHINE SULFATE (PF) 2 MG/ML IV SOLN
2.0000 mg | Freq: Once | INTRAVENOUS | Status: AC
  Administered 2016-04-05: 2 mg via INTRAVENOUS
  Filled 2016-04-05: qty 1

## 2016-04-05 MED ORDER — SODIUM CHLORIDE 0.9 % WEIGHT BASED INFUSION
1.0000 mL/kg/h | INTRAVENOUS | Status: AC
  Administered 2016-04-05 (×2): 1 mL/kg/h via INTRAVENOUS

## 2016-04-05 MED ORDER — IOPAMIDOL (ISOVUE-370) INJECTION 76%
INTRAVENOUS | Status: AC
  Filled 2016-04-05: qty 100

## 2016-04-05 MED ORDER — NITROGLYCERIN IN D5W 200-5 MCG/ML-% IV SOLN
10.0000 ug/min | INTRAVENOUS | Status: DC
  Administered 2016-04-05: 10 ug/min via INTRAVENOUS
  Filled 2016-04-05: qty 250

## 2016-04-05 MED ORDER — MIDAZOLAM HCL 2 MG/2ML IJ SOLN
INTRAMUSCULAR | Status: AC
  Filled 2016-04-05: qty 2

## 2016-04-05 SURGICAL SUPPLY — 22 items
BALLN MOZEC 1.5X9 (BALLOONS) ×2
BALLOON MOZEC 1.5X9 (BALLOONS) ×1 IMPLANT
CATH 5FR JL3.5 JR4 ANG PIG MP (CATHETERS) ×2 IMPLANT
CATH INFINITI 5 FR IM (CATHETERS) ×2 IMPLANT
CATH LAUNCHER 6FR AL1 (CATHETERS) ×1 IMPLANT
CATH OPTITORQUE TIG 4.0 5F (CATHETERS) ×2 IMPLANT
CATHETER LAUNCHER 6FR AL1 (CATHETERS) ×2
DEVICE RAD COMP TR BAND LRG (VASCULAR PRODUCTS) ×2 IMPLANT
GLIDESHEATH SLEND A-KIT 6F 22G (SHEATH) ×2 IMPLANT
GUIDE CATH RUNWAY 6FR AR1 (CATHETERS) ×2 IMPLANT
GUIDE CATH RUNWAY 6FR CLS3 (CATHETERS) ×2 IMPLANT
KIT ENCORE 26 ADVANTAGE (KITS) ×2 IMPLANT
KIT HEART LEFT (KITS) ×2 IMPLANT
PACK CARDIAC CATHETERIZATION (CUSTOM PROCEDURE TRAY) ×2 IMPLANT
SYR MEDRAD MARK V 150ML (SYRINGE) ×2 IMPLANT
TRANSDUCER W/STOPCOCK (MISCELLANEOUS) ×2 IMPLANT
TUBING CIL FLEX 10 FLL-RA (TUBING) ×2 IMPLANT
WIRE ASAHI FIELDER XT 190CM (WIRE) ×2 IMPLANT
WIRE HI TORQ VERSACORE-J 145CM (WIRE) ×2 IMPLANT
WIRE LUGE 182CM (WIRE) ×2 IMPLANT
WIRE PT2 MS 185 (WIRE) ×2 IMPLANT
WIRE SAFE-T 1.5MM-J .035X260CM (WIRE) ×2 IMPLANT

## 2016-04-05 NOTE — Progress Notes (Signed)
ANTICOAGULATION CONSULT NOTE - Follow Up Consult  Pharmacy Consult for heparin Indication: chest pain/ACS  Labs:  Recent Labs  04/04/16 1216 04/04/16 1848 04/04/16 2346 04/05/16 0515  HGB 14.0  --   --  14.0  HCT 42.2  --   --  42.8  PLT 252  --   --  239  LABPROT  --  13.6  --   --   INR  --  1.04  --   --   HEPARINUNFRC  --   --   --  0.42  CREATININE 1.01  --   --   --   TROPONINI  --  0.61* 2.11*  --     Assessment/Plan:  60yo male therapeutic on heparin with initial dosing for CP. Will continue gtt at current rate and confirm stable with additional level.   Vernard GamblesVeronda Scotland Dost, PharmD, BCPS  04/05/2016,6:08 AM

## 2016-04-05 NOTE — Care Management Note (Addendum)
Case Management Note  Patient Details  Name: Alycia RossettiCharles F Lull MRN: [id number] Date of Birth: 12/01/1955  Subjective/Objective:   S/p Coronary Balloon Angioplasty , will be on plavix, NCM will cont to follow for dc needs.  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   Patient is for dc today , [facility]  [facility] Dr[facility] Sondra ComeSamuel Woods fax [phone]  and [facility] is  Dr. Annia FriendlyBeard fax [phone].Marland Kitchen. NCM gave patient the 30 day savings card for brilinta.  [facility] [address] in [place] and they do have brilinta in stock.  Patient has transportation home.  pta indep.  NCM will fax dc information to each [facility] MD.           Action/Plan:   Expected Discharge Date:                  Expected Discharge Plan:  Home/Self Care  In-House Referral:     Discharge planning Services  CM Consult  Post Acute Care Choice:    Choice offered to:     DME Arranged:    DME Agency:     HH Arranged:    HH Agency:     Status of Service:  In process, will continue to follow  If discussed at Long Length of Stay Meetings, dates discussed:    Additional Comments:  Leone Havenaylor, Elizbeth Posa Clinton, RN 04/05/2016, 2:26 PM

## 2016-04-05 NOTE — Progress Notes (Signed)
    Pt had troponin elevations c/w NSTEMI Cath shows  Occluded LM Native RCA has prox 75%, moderate lesions elsewhere, then occluded distally The PLSA fills via collateral via the SVG to OM system    LIMA to LAD patent  SVG to OM - normal SVG to RCA is occluded  Continue imdur Agree with Ranexa I suspect the event may have involved thrombus down the RCA Will DC plavix and start Brilinta 60 mg BID     Kristeen MissPhilip Nahser, MD  04/05/2016 6:10 PM    [facility] [address],  [address] [place], [place]  [address] Pager [phone] Phone: [phone]; Fax: [phone]

## 2016-04-05 NOTE — Interval H&P Note (Signed)
History and Physical Interval Note:  04/05/2016 7:29 AM  Johnny Pearson  has presented today for surgery, with the diagnosis of NSTEMI - known CAD-CABG with left main (Rotablator-based) DES PCI during last evaluation in July of this year -- I suspect that the vein graft to the OM was occluded, and there was report of 89% RCA disease, however there is no available diagnostic cath report.  The various methods of treatment have been discussed with the patient and family. After consideration of risks, benefits and other options for treatment, the patient has consented to  Procedure(s): Left Heart Cath and Cors/Grafts Angiography (N/A) With Possible Percutaneous Coronary Intervention as a surgical intervention .  The patient's history has been reviewed, patient examined, no change in status, stable for surgery.  I have reviewed the patient's chart and labs.  Questions were answered to the patient's satisfaction.    Cath Lab Visit (complete for each Cath Lab visit)  Clinical Evaluation Leading to the Procedure:   ACS: Yes.    Non-ACS:    Anginal Classification: CCS IV  Anti-ischemic medical therapy: Maximal Therapy (2 or more classes of medications)  Non-Invasive Test Results: No non-invasive testing performed  Prior CABG: Previous CABG    Johnny Pearson

## 2016-04-05 NOTE — Progress Notes (Signed)
TR BAND REMOVAL  LOCATION:    left radial  DEFLATED PER PROTOCOL:    Yes.    TIME BAND OFF / DRESSING APPLIED:    1500   SITE UPON ARRIVAL:    Level 0  SITE AFTER BAND REMOVAL:    Level 0  CIRCULATION SENSATION AND MOVEMENT:    Within Normal Limits   Yes.    COMMENTS:   Checked site remainder of shift with no change in assessment, dressing remains dry and intact

## 2016-04-06 ENCOUNTER — Encounter (HOSPITAL_COMMUNITY): Payer: Self-pay | Admitting: Cardiology

## 2016-04-06 DIAGNOSIS — I472 Ventricular tachycardia: Secondary | ICD-10-CM

## 2016-04-06 DIAGNOSIS — I119 Hypertensive heart disease without heart failure: Secondary | ICD-10-CM

## 2016-04-06 DIAGNOSIS — I4729 Other ventricular tachycardia: Secondary | ICD-10-CM

## 2016-04-06 DIAGNOSIS — I214 Non-ST elevation (NSTEMI) myocardial infarction: Principal | ICD-10-CM

## 2016-04-06 LAB — CBC
HCT: 41.8 % (ref 39.0–52.0)
HEMOGLOBIN: 13.8 g/dL (ref 13.0–17.0)
MCH: 28.5 pg (ref 26.0–34.0)
MCHC: 33 g/dL (ref 30.0–36.0)
MCV: 86.2 fL (ref 78.0–100.0)
PLATELETS: 239 10*3/uL (ref 150–400)
RBC: 4.85 MIL/uL (ref 4.22–5.81)
RDW: 14.4 % (ref 11.5–15.5)
WBC: 10.8 10*3/uL — ABNORMAL HIGH (ref 4.0–10.5)

## 2016-04-06 LAB — BASIC METABOLIC PANEL
ANION GAP: 7 (ref 5–15)
BUN: 10 mg/dL (ref 6–20)
CALCIUM: 9.3 mg/dL (ref 8.9–10.3)
CO2: 28 mmol/L (ref 22–32)
CREATININE: 0.96 mg/dL (ref 0.61–1.24)
Chloride: 102 mmol/L (ref 101–111)
Glucose, Bld: 115 mg/dL — ABNORMAL HIGH (ref 65–99)
Potassium: 4.4 mmol/L (ref 3.5–5.1)
Sodium: 137 mmol/L (ref 135–145)

## 2016-04-06 MED ORDER — EZETIMIBE 10 MG PO TABS
10.0000 mg | ORAL_TABLET | Freq: Every day | ORAL | Status: DC
  Administered 2016-04-06: 11:00:00 10 mg via ORAL
  Filled 2016-04-06: qty 1

## 2016-04-06 MED ORDER — TICAGRELOR 60 MG PO TABS: 60.0000 mg | ORAL_TABLET | Freq: Two times a day (BID) | ORAL | 6 refills | Status: DC

## 2016-04-06 MED ORDER — TICAGRELOR 90 MG PO TABS: 90.0000 mg | ORAL_TABLET | Freq: Two times a day (BID) | ORAL | 6 refills | Status: DC

## 2016-04-06 MED ORDER — TICAGRELOR 90 MG PO TABS: 90.0000 mg | ORAL_TABLET | Freq: Two times a day (BID) | ORAL | 0 refills | Status: AC

## 2016-04-06 MED ORDER — HEART ATTACK BOUNCING BOOK
Freq: Once | Status: AC
  Administered 2016-04-06: 04:00:00
  Filled 2016-04-06: qty 1

## 2016-04-06 MED ORDER — MORPHINE SULFATE (PF) 4 MG/ML IV SOLN: 2.0000 mg | INTRAVENOUS | Status: DC | PRN

## 2016-04-06 MED ORDER — EZETIMIBE 10 MG PO TABS: 10.0000 mg | ORAL_TABLET | Freq: Every day | ORAL | 6 refills | Status: AC

## 2016-04-06 MED ORDER — ANGIOPLASTY BOOK
Freq: Once | Status: AC
  Administered 2016-04-06: 04:00:00
  Filled 2016-04-06: qty 1

## 2016-04-06 MED ORDER — RANOLAZINE ER 500 MG PO TB12: 500.0000 mg | ORAL_TABLET | Freq: Two times a day (BID) | ORAL | 6 refills | Status: AC

## 2016-04-06 MED ORDER — TICAGRELOR 60 MG PO TABS: 60.0000 mg | ORAL_TABLET | Freq: Two times a day (BID) | ORAL | 0 refills | Status: DC

## 2016-04-06 NOTE — Discharge Summary (Signed)
Discharge Summary    Patient ID: Johnny Pearson,  MRN: [id number], DOB/AGE: 10-26-1955 59 y.o.  Admit date: 04/04/2016 Discharge date: 04/06/2016  Primary Care Provider: No primary care provider on file. Primary Cardiologist: Dr. Patria Mane, MD - [facility]  Discharge Diagnoses    Principal Problem:   NSTEMI (non-ST elevated myocardial infarction) [facility])  **s/p cath this admission (results below)  Active Problems:   CAD in native artery   S/P CABG (coronary artery bypass graft)   Obesity   Hypertensive heart disease   Hyperlipidemia   Allergies Allergies  Allergen Reactions  . Sulfa Antibiotics Rash    Diagnostic Studies/Procedures    Cardiac Catheterization 11.7.2017  Coronary Findings  Dominance: Right  Left Main  Vessel is large.  Ost LM to LM lesion, 100% stenosed. The lesion is irregular and chronically occluded. The lesion was previously treated.  **Angioplasty: The intervention was unsuccessful due to inability to expand the balloon and inability to cross the lesion with wire.   There is a 100% residual stenosis post intervention.  Left Anterior Descending  Ost LAD to Prox LAD lesion, 100% stenosed. The lesion is chronically occluded. The lesion is severely calcified.  First Diagonal Branch  Vessel is moderate in size.  Lateral First Diagonal Branch  Vessel is small in size.  First Septal Branch  Vessel is moderate in size.  Second Septal Branch  Vessel is small in size.  Third Diagonal Branch  Vessel is small in size.  Third Septal Branch  Vessel is small in size.  Left Circumflex  Ost Cx to Mid Cx lesion, 100% stenosed. The lesion is severely calcified. The lesion was previously treated.  Mid Cx to Dist Cx lesion, 99% stenosed.  Second Obtuse Marginal Branch  2nd Mrg lesion, 100% stenosed. The lesion is chronically occluded.  Third Obtuse Marginal Branch  Vessel is small in size. There is severe disease in the vessel. 3rd Mrg filled by  collaterals from 2nd RPLB.  Right Coronary Artery  Vessel is large. The vessel is severely calcified. The vessel is mildly tortuous.  Prox RCA lesion, 75% stenosed. The lesion is eccentric and irregular. The lesion is severely calcified. diffuse  Mid RCA to Dist RCA lesion, 70% stenosed. The lesion is segmental and irregular. The lesion is moderately calcified.  Acute Marginal Branch  Vessel is small in size.  Right Posterior Descending Artery  Vessel is moderate in size. There is mild disease in the vessel.  Inferior Septal  Vessel is small in size.  Right Posterior Atrioventricular Branch  Vessel is small in size.  Post Atrio lesion, 99% stenosed.  Graft Angiography  saphenous Graft to 2nd Mrg  SVG and is normal in caliber and large. The graft exhibits minimal luminal irregularities.  Sequential jump graft Graft to Acute Mrg, Ost RPDA  Seq SVG- RVM-rPDA graft was visualized by angiography. Known to be occluded  Prox Graft to Dist Graft lesion before Acute Mrg, 100% stenosed. The lesion is chronically occluded.  Free LIMA Graft to Dist LAD  LIMA and is large. The graft exhibits minimal luminal irregularities. tortuous  Wall Motion  Unable to assess regional wall motion abnormality        Left Heart  Left Ventricle The left ventricular size is normal. The left ventricular systolic function is normal. LV end diastolic pressure is mildly elevated. The left ventricular ejection fraction is 55-65% by visual estimate. Unable to assess    _____________   History of Present  Illness     60 year old male with a prior history of coronary artery disease status post coronary artery bypass grafting 4 as well as more recent PCI and stent placement to the [facility] left main [facility] over the summer of 2017. Other history includes hypertension, hyperlipidemia, and obesity. He was in his usual state of health until 04/04/2016 when he developed chest pain and dyspnea while at work.  This prompted him to present to the emergency department. ECG was nonacute. Initial troponin was normal. He was admitted for further evaluation.  Hospital Course     Consultants: None  Patient ruled in for a non-ST segment elevation myocardial infarction, eventually peaking his troponin at 2.11. Decision was made to pursue diagnostic catheterization, which was performed on November 7 and revealed an occlusion of the previously placed left main stent. The ostial LAD and circumflex were occluded and he had diffuse disease throughout the right coronary artery. The LIMA to the LAD along with the vein graft to the obtuse marginal were patent. An attempt was made at wiring the left main however the interventional team was able to successfully pass the wire and the case was aborted. Medical therapy was recommended and Ranexa therapy was added.  Post procedure, he has been ambulate without recurrent symptoms or limitations. He remains on aspirin, high potency statin, nitrate, beta blocker, and Ranexa therapy. In the setting of acute coronary syndrome, he was switched from Plavix therapy, which he had been on at home, to brilinta therapy. We have also added Zetia on top of statin therapy in the setting of an LDL of 121 despite high potency statin at baseline. He will be discharged home today in good condition and will plan to follow-up at the [facility] in [place] with his primary cardiologist within the next 1-2 weeks.  _____________  Discharge Vitals Blood pressure (!) 134/56, pulse 70, temperature 98 F (36.7 C), temperature source Oral, resp. rate (!) 25, height 5\' 7"  (1.702 m), weight 194 lb 7.1 oz (88.2 kg), SpO2 95 %.  Filed Weights   04/04/16 1149 04/04/16 1847 04/06/16 0400  Weight: 195 lb (88.5 kg) 192 lb 6.4 oz (87.3 kg) 194 lb 7.1 oz (88.2 kg)    Labs & Radiologic Studies    CBC  Recent Labs  04/05/16 0515 04/06/16 0421  WBC 9.9 10.8*  HGB 14.0 13.8  HCT 42.8 41.8  MCV 87.0  86.2  PLT 239 239   Basic Metabolic Panel  Recent Labs  04/05/16 0206 04/05/16 0515 04/06/16 0421  NA  --  138 137  K  --  5.4* 4.4  CL  --  105 102  CO2  --  21* 28  GLUCOSE  --  110* 115*  BUN  --  14 10  CREATININE  --  0.87 0.96  CALCIUM  --  8.9 9.3  MG 2.3  --   --    Liver Function Tests  Recent Labs  04/04/16 1216  AST 28  ALT 25  ALKPHOS 80  BILITOT 2.1*  PROT 6.8  ALBUMIN 4.0   Cardiac Enzymes  Recent Labs  04/04/16 1848 04/04/16 2346 04/05/16 0515  TROPONINI 0.61* 2.11* 1.98*   Hemoglobin A1C  Recent Labs  04/04/16 1848  HGBA1C 5.9*   Fasting Lipid Panel  Recent Labs  04/05/16 0515  CHOL 187  HDL 45  LDLCALC 121*  TRIG 106  CHOLHDL 4.2   Thyroid Function Tests  Recent Labs  04/04/16 1848  TSH 2.239   _____________  Dg Chest Port 1 View  Result Date: 04/04/2016 CLINICAL DATA:  Chest pain EXAM: PORTABLE CHEST 1 VIEW COMPARISON:  None. FINDINGS: Cardiomediastinal silhouette is unremarkable. Status post CABG. No infiltrate or pleural effusion. No pulmonary edema. IMPRESSION: No active disease. Electronically Signed   By: Natasha Mead M.D.   On: 04/04/2016 14:31   Disposition   Pt is being discharged home today in good condition.  Follow-up Plans & Appointments    Follow-up Information    Dr. Patria Mane Follow up in 1 week(s).   Contact information: [facility]         Discharge Instructions    Amb Referral to Cardiac Rehabilitation    Complete by:  As directed    Diagnosis:  NSTEMI      Discharge Medications   Current Discharge Medication List    START taking these medications   Details  ezetimibe (ZETIA) 10 MG tablet Take 1 tablet (10 mg total) by mouth daily. Qty: 30 tablet, Refills: 6    ranolazine (RANEXA) 500 MG 12 hr tablet Take 1 tablet (500 mg total) by mouth 2 (two) times daily. Qty: 60 tablet, Refills: 6    ticagrelor (BRILINTA) 90 MG TABS tablet Take 1 tablet (90 mg total) by mouth 2 (two)  times daily. Qty: 30 tablet, Refills: 0      CONTINUE these medications which have NOT CHANGED   Details  aspirin EC 81 MG tablet Take 81 mg by mouth daily.    isosorbide mononitrate (IMDUR) 30 MG 24 hr tablet Take 30 mg by mouth daily.    metoprolol succinate (TOPROL-XL) 100 MG 24 hr tablet Take 50 mg by mouth daily. Take with or immediately following a meal.    nitroGLYCERIN (NITROSTAT) 0.4 MG SL tablet Place 0.4 mg under the tongue every 5 (five) minutes as needed for chest pain.    rosuvastatin (CRESTOR) 40 MG tablet Take 40 mg by mouth at bedtime.     sertraline (ZOLOFT) 100 MG tablet Take 50 mg by mouth daily.      STOP taking these medications     clopidogrel (PLAVIX) 75 MG tablet      ibuprofen (ADVIL,MOTRIN) 200 MG tablet          Aspirin prescribed at discharge?  Yes High Intensity Statin Prescribed? (Lipitor 40-80mg  or Crestor 20-40mg ): Yes Beta Blocker Prescribed? Yes For EF <40%, was ACEI/ARB Prescribed? No: N/A ADP Receptor Inhibitor Prescribed? (i.e. Plavix etc.-Includes Medically Managed Patients): Yes For EF <40%, Aldosterone Inhibitor Prescribed? No: N/A Was EF assessed during THIS hospitalization? Yes Was Cardiac Rehab II ordered? (Included Medically managed Patients): Yes   Outstanding Labs/Studies   None  Duration of Discharge Encounter   Greater than 30 minutes including physician time.  Signed, Nicolasa Ducking NP 04/06/2016, 10:44 AM  Attending Note:   The patient was seen and examined.  Agree with assessment and plan as noted above.  Changes made to the above note as needed.  Patient seen and independently examined with Ward Givens, NP.   We discussed all aspects of the encounter. I agree with the assessment and plan as stated above.  1. CAD:  His LM stent  is now occluded.   Has disease in his RCA .  Will change his Plavix to Brilinta.  See progress note from same day      I have spent a total of 40 minutes with patient reviewing  hospital  notes , telemetry, EKGs, labs and  examining patient as well as establishing an assessment and plan that was discussed with the patient. > 50% of time was spent in direct patient care.    Vesta MixerPhilip J. Pearson, Johnny HagemanJr., MD, [facility] 04/06/2016, 6:32 PM [address],  [address] Office [phone]- [phone] Pager [phone]- [phone]

## 2016-04-06 NOTE — Progress Notes (Signed)
CARDIAC REHAB PHASE I   PRE:  Rate/Rhythm: 73 SR    BP: sitting 140/81    SaO2:   MODE:  Ambulation: 1000 ft   POST:  Rate/Rhythm: 83 SR    BP: sitting 150/77     SaO2:   Pt able to walk 1000 ft without CP or difficulties. He got his Imdur/Ranexa 1 hr prior. BP elevated. Ed completed with good comprehension. He walks most days but needs to work on his diet. Discussed pre-DM. [facility].  [id number]   Johnny MassonRandi Kristan Shondrika Pearson CES, ACSM 04/06/2016 9:45 AM

## 2016-04-06 NOTE — Discharge Instructions (Signed)

## 2016-04-06 NOTE — Progress Notes (Signed)
Patient Name: Johnny RossettiCharles F Pearson Date of Encounter: 04/06/2016  Primary Cardiologist: [facility]ard [facility]  Hospital Problem List     Principal Problem:   NSTEMI (non-ST elevated myocardial infarction) [facility](HCC) Active Problems:   CAD in native artery   S/P CABG (coronary artery bypass graft)   Obesity   Hypertensive heart disease   Hyperlipidemia   NSVT  Subjective   No c/p or sob overnight.  Ambulated this AM without difficulty or recurrent c/p/dyspnea.  Eager to go home.  Inpatient Medications    . aspirin EC  81 mg Oral Daily  . isosorbide mononitrate  30 mg Oral Daily  . metoprolol succinate  50 mg Oral Daily  . ranolazine  500 mg Oral BID  . rosuvastatin  40 mg Oral Daily  . sertraline  50 mg Oral Daily  . sodium chloride flush  3 mL Intravenous Q12H  . ticagrelor  60 mg Oral BID    Vital Signs    Vitals:   04/05/16 2024 04/06/16 0400 04/06/16 0700 04/06/16 0800  BP: 125/71 140/85 (!) 134/56   Pulse: 68 71 70   Resp: (!) 28 (!) 21 (!) 27 (!) 25  Temp: 98.9 F (37.2 C) 98.4 F (36.9 C) 98 F (36.7 C)   TempSrc: Oral Oral Oral   SpO2: 95% 95% 95%   Weight:  194 lb 7.1 oz (88.2 kg)    Height:        Intake/Output Summary (Last 24 hours) at 04/06/16 0956 Last data filed at 04/05/16 1759  Gross per 24 hour  Intake              480 ml  Output              800 ml  Net             -320 ml   Filed Weights   04/04/16 1149 04/04/16 1847 04/06/16 0400  Weight: 195 lb (88.5 kg) 192 lb 6.4 oz (87.3 kg) 194 lb 7.1 oz (88.2 kg)    Physical Exam   GEN: Well nourished, well developed, in no acute distress.  HEENT: Grossly normal.  Neck: Supple, no JVD, carotid bruits, or masses. Cardiac: RRR, no murmurs, rubs, or gallops. No clubbing, cyanosis, edema.  Radials/DP/PT 2+ and equal bilaterally.  L wrist cath site w/o bleeding/bruit/hematoma. Respiratory:  Respirations regular and unlabored, clear to auscultation bilaterally. GI: Soft, nontender, nondistended, BS + x  4. MS: no deformity or atrophy. Skin: warm and dry, no rash. Neuro:  Strength and sensation are intact. Psych: AAOx3.  Normal affect.  Labs    CBC  Recent Labs  04/05/16 0515 04/06/16 0421  WBC 9.9 10.8*  HGB 14.0 13.8  HCT 42.8 41.8  MCV 87.0 86.2  PLT 239 239   Basic Metabolic Panel  Recent Labs  04/05/16 0206 04/05/16 0515 04/06/16 0421  NA  --  138 137  K  --  5.4* 4.4  CL  --  105 102  CO2  --  21* 28  GLUCOSE  --  110* 115*  BUN  --  14 10  CREATININE  --  0.87 0.96  CALCIUM  --  8.9 9.3  MG 2.3  --   --    Liver Function Tests  Recent Labs  04/04/16 1216  AST 28  ALT 25  ALKPHOS 80  BILITOT 2.1*  PROT 6.8  ALBUMIN 4.0   Cardiac Enzymes  Recent Labs  04/04/16 1848 04/04/16 2346  04/05/16 0515  TROPONINI 0.61* 2.11* 1.98*   Hemoglobin A1C  Recent Labs  04/04/16 1848  HGBA1C 5.9*   Fasting Lipid Panel  Recent Labs  04/05/16 0515  CHOL 187  HDL 45  LDLCALC 121*  TRIG 106  CHOLHDL 4.2   Thyroid Function Tests  Recent Labs  04/04/16 1848  TSH 2.239    Telemetry    Rsr, 12 beats nsvt.  ECG    RSR, 80, LAE, nonspecific st/t changes.  Radiology    Dg Chest Port 1 View  Result Date: 04/04/2016 CLINICAL DATA:  Chest pain EXAM: PORTABLE CHEST 1 VIEW COMPARISON:  None. FINDINGS: Cardiomediastinal silhouette is unremarkable. Status post CABG. No infiltrate or pleural effusion. No pulmonary edema. IMPRESSION: No active disease. Electronically Signed   By: Natasha MeadLiviu  Pop M.D.   On: 04/04/2016 14:31    Patient Profile     60 y/o ? admitted 11/6 with chest pain and NSTEMI, s/p cath on 11/7 revealing occluded LM stent, patent lima  lad and vg  OM with occluded VG  RCA  PDA, and diffuse/severe distal LCX, prox/dist RCA dzs.  Assessment & Plan    1.  NSTEMI/CAD:  S/p prior CABG and subsequent PCI/DES of the LM in 11/2015. Presented 11/6 w/ sudden onset c/p and mild trop elevation.  Cath yesterday revealed occluded LM stent with  patent patent lima  lad and vg  OM, occluded VG  RCA  PDA, and diffuse/severe distal LCX, prox/dist RCA dzs.  Unclear culprit given diffuse nature of dzs and minimal trop rise.  Med therapy rec and ranexa added atop baseline asa, statin,  blocker, and nitrate therapy. Plavix switched to brilinta given concern for possible RCA thrombotic event.  No c/p overnight.  Ambulated this AM w/o difficulty of symptoms and now eager for d/c.  Cont med Rx. He will f/u with cardi[facility] @ the [facility].  2.  Hypertensive Heart dzs:  BP stable on current therapy.  3.  HL:  LDL 121 on crestor 40 @ home.  He reports compliance with this and says that his numbers typically run high.  He is willing to try zetia and thinks he will be able to get that from the [facility].  Ultimately, he would likely benefit from a PCSK9 inhibitor.  4.  Morbid Obesity:  He will benefit from cardiac rehab.  5.  NSVT:  Lytes ok.  Nl EF.  Cont  blocker.  Signed, Nicolasa Duckinghristopher Berge NP 04/06/2016, 9:56 AM   Attending Note:   The patient was seen and examined.  Agree with assessment and plan as noted above.  Changes made to the above note as needed.  Patient seen and independently examined with Ward Givenshris Berge.   We discussed all aspects of the encounter. I agree with the assessment and plan as stated above.  We have changed to Brilinta. Originally  I prescribed 60 mg BID. Will change it to 90 mg BID  Will follow up in the [facility]. He may come see us if needed   I have spent a total of 40 minutes with patient reviewing hospital  notes , telemetry, EKGs, labs and examining patient as well as establishing an assessment and plan that was discussed with the patient. > 50% of time was spent in direct patient care.    Vesta MixerPhilip J. Massiah Longanecker, Montez HagemanJr., MD, [facility] 04/06/2016, 10:39 AM [address],  [address] Office [phone]- [phone] Pager [phone]

## 2016-04-07 MED FILL — Lidocaine HCl Local Preservative Free (PF) Inj 1%: INTRAMUSCULAR | Qty: 30 | Status: AC

## 2016-04-07 MED FILL — Morphine Sulfate Inj 4 MG/ML: INTRAMUSCULAR | Qty: 1 | Status: AC

## 2016-05-10 DIAGNOSIS — I25119 Atherosclerotic heart disease of native coronary artery with unspecified angina pectoris: Secondary | ICD-10-CM | POA: Diagnosis present

## 2017-03-15 IMAGING — DX DG CHEST 1V PORT
1 series · 1 of 1 positions shown · non-contrast
Comparison: None.

CLINICAL DATA: Chest pain

EXAM:
PORTABLE CHEST 1 VIEW

[chest ap]
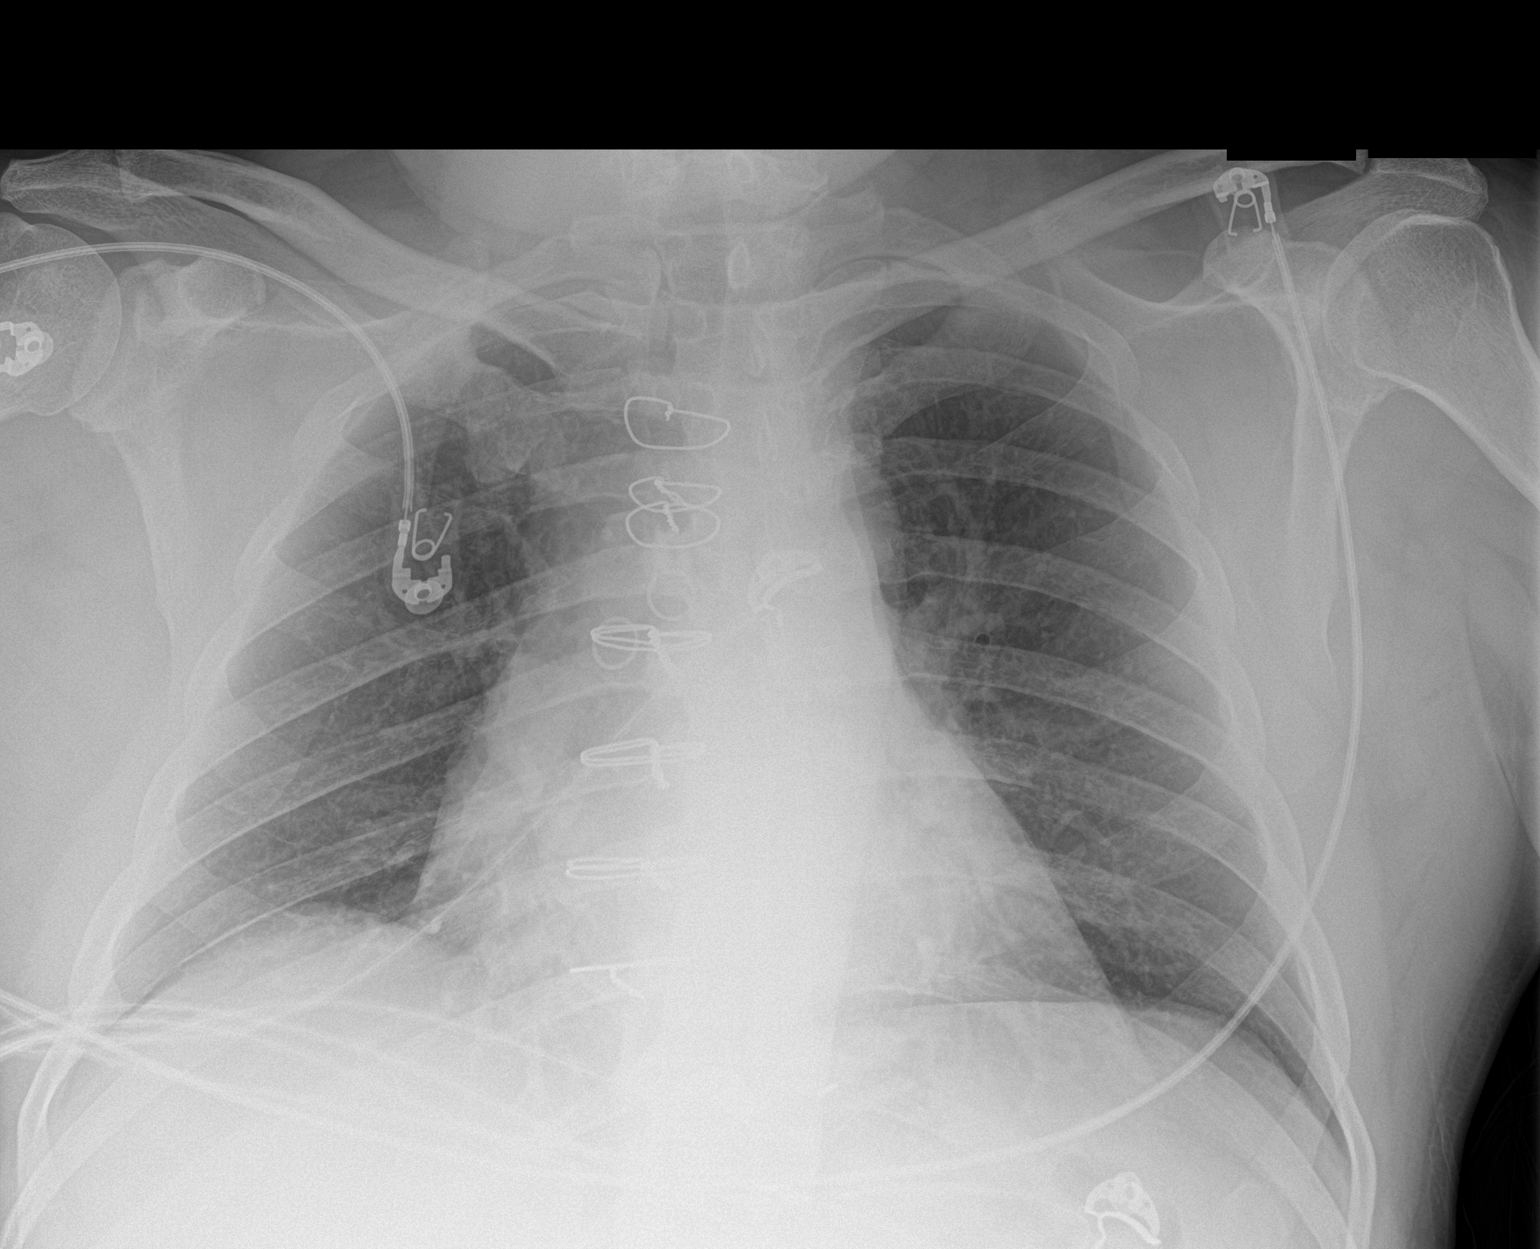

[1 of 1 positions shown; findings below may reference images not displayed]

FINDINGS: Cardiomediastinal silhouette is unremarkable. Status post CABG. No
infiltrate or pleural effusion. No pulmonary edema.
IMPRESSION: No active disease.
